# Patient Record
Sex: Male | Born: 1985 | Race: Black or African American | Hispanic: No | Marital: Single | Smoking: Current every day smoker
Health system: Southern US, Community
[De-identification: ages and names within clinical notes are randomized; demographics above are authoritative.]

---

## 2006-10-14 ENCOUNTER — Emergency Department (HOSPITAL_COMMUNITY): Admission: EM | Admit: 2006-10-14 | Discharge: 2006-10-14 | Payer: Self-pay | Admitting: Family Medicine

## 2006-10-17 ENCOUNTER — Emergency Department (HOSPITAL_COMMUNITY): Admission: EM | Admit: 2006-10-17 | Discharge: 2006-10-17 | Payer: Self-pay | Admitting: Emergency Medicine

## 2013-06-17 ENCOUNTER — Emergency Department (HOSPITAL_COMMUNITY)
Admission: EM | Admit: 2013-06-17 | Discharge: 2013-06-17 | Disposition: A | Discharge status: Home / Self Care | Payer: No Typology Code available for payment source | Attending: Emergency Medicine | Admitting: Emergency Medicine

## 2013-06-17 ENCOUNTER — Encounter (HOSPITAL_COMMUNITY): Payer: Self-pay | Admitting: Emergency Medicine

## 2013-06-17 ENCOUNTER — Emergency Department (HOSPITAL_COMMUNITY): Payer: No Typology Code available for payment source

## 2013-06-17 DIAGNOSIS — M791 Myalgia, unspecified site: Secondary | ICD-10-CM

## 2013-06-17 DIAGNOSIS — IMO0002 Reserved for concepts with insufficient information to code with codable children: Secondary | ICD-10-CM | POA: Insufficient documentation

## 2013-06-17 DIAGNOSIS — S0993XA Unspecified injury of face, initial encounter: Secondary | ICD-10-CM | POA: Insufficient documentation

## 2013-06-17 DIAGNOSIS — S298XXA Other specified injuries of thorax, initial encounter: Secondary | ICD-10-CM | POA: Insufficient documentation

## 2013-06-17 DIAGNOSIS — Y92410 Unspecified street and highway as the place of occurrence of the external cause: Secondary | ICD-10-CM | POA: Insufficient documentation

## 2013-06-17 DIAGNOSIS — S46909A Unspecified injury of unspecified muscle, fascia and tendon at shoulder and upper arm level, unspecified arm, initial encounter: Secondary | ICD-10-CM | POA: Insufficient documentation

## 2013-06-17 DIAGNOSIS — F17200 Nicotine dependence, unspecified, uncomplicated: Secondary | ICD-10-CM | POA: Insufficient documentation

## 2013-06-17 DIAGNOSIS — S4980XA Other specified injuries of shoulder and upper arm, unspecified arm, initial encounter: Secondary | ICD-10-CM | POA: Insufficient documentation

## 2013-06-17 DIAGNOSIS — M25511 Pain in right shoulder: Secondary | ICD-10-CM

## 2013-06-17 DIAGNOSIS — Y9389 Activity, other specified: Secondary | ICD-10-CM | POA: Insufficient documentation

## 2013-06-17 LAB — RAPID URINE DRUG SCREEN, HOSP PERFORMED
Amphetamines: NOT DETECTED
Benzodiazepines: NOT DETECTED
Opiates: NOT DETECTED

## 2013-06-17 MED ORDER — CYCLOBENZAPRINE HCL 10 MG PO TABS: 10.0000 mg | ORAL_TABLET | Freq: Two times a day (BID) | ORAL | Status: DC | PRN
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
## 2013-06-19 NOTE — ED Provider Notes (Signed)
Medical screening examination/treatment/procedure(s) were performed by non-physician practitioner and as supervising physician I was immediately available for consultation/collaboration.  Flint Melter, MD 06/19/13 Lyda Jester

## 2013-06-20 ENCOUNTER — Inpatient Hospital Stay: Payer: Self-pay

## 2013-09-05 ENCOUNTER — Encounter (HOSPITAL_COMMUNITY): Payer: Self-pay | Admitting: Emergency Medicine

## 2013-09-05 ENCOUNTER — Emergency Department (INDEPENDENT_AMBULATORY_CARE_PROVIDER_SITE_OTHER)
Admission: EM | Admit: 2013-09-05 | Discharge: 2013-09-05 | Disposition: A | Discharge status: Home / Self Care | Payer: Self-pay | Source: Home / Self Care | Attending: Emergency Medicine | Admitting: Emergency Medicine

## 2013-09-05 DIAGNOSIS — H109 Unspecified conjunctivitis: Secondary | ICD-10-CM

## 2013-09-05 MED ORDER — POLYMYXIN B-TRIMETHOPRIM 10000-0.1 UNIT/ML-% OP SOLN: 1.0000 [drp] | OPHTHALMIC | Status: DC

## 2013-09-05 NOTE — ED Notes (Signed)
C/o  Pain.  Redness.  And irritation of the right eye x 2 days.  States "eye is crusted over in the morning".  Denies fever, n/v/d.  No relief with otc eye drops.

## 2013-09-05 NOTE — ED Provider Notes (Signed)
  Chief Complaint   Chief Complaint  Patient presents with  . Conjunctivitis    History of Present Illness   Cecille AmsterdamCory J Moone is a 28 year old male three-day history of redness, irritation, itching, and burning the right eye. He's had a small amount of yellow drainage and some photophobia. His vision is normal. No trauma or foreign body. No suspicious exposures. He's had slight cough but no other upper respiratory symptoms.  Review of Systems   Other than as noted above, the patient denies any of the following symptoms: Systemic:  No fever, chills, sweats, fatigue, or weight loss. Eye:  No redness, eye pain, photophobia, discharge, blurred vision, or diplopia. ENT:  No nasal congestion, rhinorrhea, or sore throat. Lymphatic:  No adenopathy. Skin:  No rash or pruritis.  PMFSH   Past medical history, family history, social history, meds, and allergies were reviewed.    Physical Examination    Vital signs:  BP 112/77  Pulse 60  Temp(Src) 98.8 F (37.1 C) (Oral)  Resp 16  SpO2 100%  Visual Acuity:  Right Eye Distance: 20/25 Left Eye Distance: 20/30 Bilateral Distance: 20/20  General:  Alert and in no distress. Eye:  His lids are slightly swollen and tender. He has moderate conjunctival injection. No discharge or foreign body noted. Cornea was intact to fluorescein staining, anterior chambers normal, PERRLA, full EOMs. Fundi were benign. ENT:  TMs and canals clear.  Nasal mucosa normal.  No intra-oral lesions, mucous membranes moist, pharynx clear. Neck:  No adenopathy tenderness or mass. Skin:  Clear, warm and dry.  Assessment   The encounter diagnosis was Conjunctivitis.  Plan     1.  Meds:  The following meds were prescribed:   Discharge Medication List as of 09/05/2013  9:47 AM    START taking these medications   Details  trimethoprim-polymyxin b (POLYTRIM) ophthalmic solution Place 1 drop into the right eye every 4 (four) hours., Starting 09/05/2013, Until  Discontinued, Normal        2.  Patient Education/Counseling:  The patient was given appropriate handouts, self care instructions, and instructed in symptomatic relief.    3.  Follow up:  The patient was told to follow up here if no better in 3 to 4 days, or sooner if becoming worse in any way, and given some red flag symptoms such as increasing pain or changes in vision which would prompt immediate return.  Follow up here as needed.      Reuben Likesavid C Baltazar Pekala, MD 09/05/13 1025

## 2013-09-05 NOTE — Discharge Instructions (Signed)
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
## 2014-11-14 ENCOUNTER — Emergency Department (HOSPITAL_COMMUNITY)
Admission: EM | Admit: 2014-11-14 | Discharge: 2014-11-14 | Disposition: A | Discharge status: Home / Self Care | Payer: Self-pay | Attending: Emergency Medicine | Admitting: Emergency Medicine

## 2014-11-14 ENCOUNTER — Encounter (HOSPITAL_COMMUNITY): Payer: Self-pay | Admitting: Emergency Medicine

## 2014-11-14 DIAGNOSIS — Z720 Tobacco use: Secondary | ICD-10-CM | POA: Insufficient documentation

## 2014-11-14 DIAGNOSIS — B029 Zoster without complications: Secondary | ICD-10-CM | POA: Insufficient documentation

## 2014-11-14 MED ORDER — PREDNISONE 20 MG PO TABS: 60.0000 mg | ORAL_TABLET | Freq: Every day | ORAL | Status: AC

## 2014-11-14 MED ORDER — PREDNISONE 20 MG PO TABS
60.0000 mg | ORAL_TABLET | Freq: Once | ORAL | Status: AC
  Administered 2014-11-14: 60 mg via ORAL
  Filled 2014-11-14: qty 3

## 2014-11-14 MED ORDER — OXYCODONE-ACETAMINOPHEN 5-325 MG PO TABS: 1.0000 | ORAL_TABLET | ORAL | Status: AC | PRN

## 2014-11-14 MED ORDER — OXYCODONE-ACETAMINOPHEN 5-325 MG PO TABS
1.0000 | ORAL_TABLET | Freq: Once | ORAL | Status: AC
  Administered 2014-11-14: 1 via ORAL
  Filled 2014-11-14: qty 1

## 2014-11-14 MED ORDER — ACYCLOVIR 200 MG PO CAPS: 800.0000 mg | ORAL_CAPSULE | Freq: Every day | ORAL | Status: AC

## 2014-11-14 NOTE — ED Provider Notes (Signed)
CSN: [id number]     Arrival date & time 11/14/14  0035 History   First MD Initiated Contact with Patient 11/14/14 0324     This chart was scribed for Vincent Boozeavid Joe Gee, MD by Arlan OrganAshley Leger, ED Scribe. This patient was seen in room A06C/A06C and the patient's care was started 3:26 AM.   Chief Complaint  Patient presents with  . Rash   The history is provided by the patient. No language interpreter was used.    HPI Comments: Vincent Rivera is a 29 y.o. male without any pertinent past medical historywho presents to the Emergency Department complaining of constant, ongoing, progressively worsening, painful, vesicular rash to the T-10 nerve root distribution x 2 days. Pt also reports burning, sharp sensation to same area x 1 week. He has tried OTC topical anti-itch ointment without any improvement for symptoms. No recent fever, chills, nausea, vomiting, or diarrhea. No known allergies to medications.  History reviewed. No pertinent past medical history. History reviewed. No pertinent past surgical history. No family history on file. History  Substance Use Topics  . Smoking status: Current Every Day Smoker -- 0.50 packs/day    Types: Cigarettes  . Smokeless tobacco: Not on file  . Alcohol Use: Yes    Review of Systems  Constitutional: Negative for fever and chills.  Respiratory: Negative for shortness of breath.   Gastrointestinal: Negative for nausea, vomiting and diarrhea.  Skin: Positive for rash.  Psychiatric/Behavioral: Negative for confusion.      Allergies  Review of patient's allergies indicates no known allergies.  Home Medications   Prior to Admission medications   Medication Sig Start Date End Date Taking? Authorizing Provider  cyclobenzaprine (FLEXERIL) 10 MG tablet Take 1 tablet (10 mg total) by mouth 2 (two) times daily as needed for muscle spasms. 06/17/13   Marissa Sciacca, PA-C  trimethoprim-polymyxin b (POLYTRIM) ophthalmic solution Place 1 drop into the right eye  every 4 (four) hours. 09/05/13   Reuben Likesavid C Keller, MD   Triage Vitals: BP 128/76 mmHg  Pulse 65  Temp(Src) 97.8 F (36.6 C) (Oral)  Resp 16  SpO2 100%   Physical Exam  Constitutional: He is oriented to person, place, and time. He appears well-developed and well-nourished.  HENT:  Head: Normocephalic and atraumatic.  Eyes: EOM are normal. Pupils are equal, round, and reactive to light.  Neck: Normal range of motion. Neck supple. No JVD present.  Cardiovascular: Normal rate, regular rhythm, normal heart sounds and intact distal pulses.   No murmur heard. Pulmonary/Chest: Effort normal and breath sounds normal. He has no wheezes. He has no rales. He exhibits no tenderness.  Abdominal: Soft. Bowel sounds are normal. He exhibits no distension and no mass.  Musculoskeletal: Normal range of motion. He exhibits no edema.  Lymphadenopathy:    He has no cervical adenopathy.  Neurological: He is alert and oriented to person, place, and time. No cranial nerve deficit. He exhibits normal muscle tone. Coordination normal.  Skin: Skin is warm and dry. Rash noted. There is erythema.  Vesicular rash on erythematous base of L mid abdomen and upper lumbar area corresponding to T-10 dermatone consistent with herpes zoster   Psychiatric: He has a normal mood and affect. His behavior is normal. Judgment and thought content normal.  Nursing note and vitals reviewed.   ED Course  Procedures (including critical care time)  DIAGNOSTIC STUDIES: Oxygen Saturation is 100% on RA, Normal by my interpretation.    COORDINATION OF CARE: 3:32 AM-Discussed treatment plan  with pt at bedside and pt agreed to plan.     MDM   Final diagnoses:  Herpes zoster    Rash and symptoms typical of herpes zoster in the left T10 dermatome. He is discharged with prescriptions for prednisone, oxycodone acetaminophen, and acyclovir.  I personally performed the services described in this documentation, which was scribed in my  presence. The recorded information has been reviewed and is accurate.      Vincent Boozeavid Macel Yearsley, MD 11/14/14 [phone]

## 2014-11-14 NOTE — Discharge Instructions (Signed)
Take acetaminophen or ibuprofen for less severe pain.   Shingles Shingles (herpes zoster) is an infection that is caused by the same virus that causes chickenpox (varicella). The infection causes a painful skin rash and fluid-filled blisters, which eventually break open, crust over, and heal. It may occur in any area of the body, but it usually affects only one side of the body or face. The pain of shingles usually lasts about 1 month. However, some people with shingles may develop long-term (chronic) pain in the affected area of the body. Shingles often occurs many years after the person had chickenpox. It is more common:  In people older than 50 years.  In people with weakened immune systems, such as those with HIV, AIDS, or cancer.  In people taking medicines that weaken the immune system, such as transplant medicines.  In people under great stress. CAUSES  Shingles is caused by the varicella zoster virus (VZV), which also causes chickenpox. After a person is infected with the virus, it can remain in the person's body for years in an inactive state (dormant). To cause shingles, the virus reactivates and breaks out as an infection in a nerve root. The virus can be spread from person to person (contagious) through contact with open blisters of the shingles rash. It will only spread to people who have not had chickenpox. When these people are exposed to the virus, they may develop chickenpox. They will not develop shingles. Once the blisters scab over, the person is no longer contagious and cannot spread the virus to others. SIGNS AND SYMPTOMS  Shingles shows up in stages. The initial symptoms may be pain, itching, and tingling in an area of the skin. This pain is usually described as burning, stabbing, or throbbing.In a few days or weeks, a painful red rash will appear in the area where the pain, itching, and tingling were felt. The rash is usually on one side of the body in a band or belt-like  pattern. Then, the rash usually turns into fluid-filled blisters. They will scab over and dry up in approximately 2-3 weeks. Flu-like symptoms may also occur with the initial symptoms, the rash, or the blisters. These may include:  Fever.  Chills.  Headache.  Upset stomach. DIAGNOSIS  Your health care provider will perform a skin exam to diagnose shingles. Skin scrapings or fluid samples may also be taken from the blisters. This sample will be examined under a microscope or sent to a lab for further testing. TREATMENT  There is no specific cure for shingles. Your health care provider will likely prescribe medicines to help you manage the pain, recover faster, and avoid long-term problems. This may include antiviral drugs, anti-inflammatory drugs, and pain medicines. HOME CARE INSTRUCTIONS   Take a cool bath or apply cool compresses to the area of the rash or blisters as directed. This may help with the pain and itching.   Take medicines only as directed by your health care provider.   Rest as directed by your health care provider.  Keep your rash and blisters clean with mild soap and cool water or as directed by your health care provider.  Do not pick your blisters or scratch your rash. Apply an anti-itch cream or numbing creams to the affected area as directed by your health care provider.  Keep your shingles rash covered with a loose bandage (dressing).  Avoid skin contact with:  Babies.   Pregnant women.   Children with eczema.   Elderly people with  transplants.   People with chronic illnesses, such as leukemia or AIDS.   Wear loose-fitting clothing to help ease the pain of material rubbing against the rash.  Keep all follow-up visits as directed by your health care provider.If the area involved is on your face, you may receive a referral for a specialist, such as an eye doctor (ophthalmologist) or an ear, nose, and throat (ENT) doctor. Keeping all follow-up  visits will help you avoid eye problems, chronic pain, or disability.  SEEK IMMEDIATE MEDICAL CARE IF:   You have facial pain, pain around the eye area, or loss of feeling on one side of your face.  You have ear pain or ringing in your ear.  You have loss of taste.  Your pain is not relieved with prescribed medicines.   Your redness or swelling spreads.   You have more pain and swelling.  Your condition is worsening or has changed.   You have a fever. MAKE SURE YOU:  Understand these instructions.  Will watch your condition.  Will get help right away if you are not doing well or get worse. Document Released: 06/23/2005 Document Revised: 11/07/2013 Document Reviewed: 02/05/2012 [facility] Patient Information 2015 [employer] [place]. This information is not intended to replace advice given to you by your health care provider. Make sure you discuss any questions you have with your health care provider.  Acyclovir tablets or capsules What is this medicine? ACYCLOVIR (ay SYE kloe veer) is an antiviral medicine. It is used to treat or prevent infections caused by certain kinds of viruses. Examples of these infections include herpes and shingles. This medicine will not cure herpes. This medicine may be used for other purposes; ask your health care provider or pharmacist if you have questions. COMMON BRAND NAME(S): Zovirax What should I tell my health care provider before I take this medicine? They need to know if you have any of these conditions: -kidney disease -an unusual or allergic reaction to acyclovir, ganciclovir, valacyclovir, other medicines, foods, dyes, or preservatives -pregnant or trying to get pregnant -breast-feeding How should I use this medicine? Take this medicine by mouth with a glass of water. Follow the directions on the prescription label. You can take it with or without food. Take your medicine at regular intervals. Do not take your medicine more often than  directed. Take all of your medicine as directed even if you think your are better. Do not skip doses or stop your medicine early. Talk to your pediatrician regarding the use of this medicine in children. While this drug may be prescribed for selected conditions, precautions do apply. Overdosage: If you think you have taken too much of this medicine contact a poison control center or emergency room at once. NOTE: This medicine is only for you. Do not share this medicine with others. What if I miss a dose? If you miss a dose, take it as soon as you can. If it is almost time for your next dose, take only that dose. Do not take double or extra doses. What may interact with this medicine? -probenecid This list may not describe all possible interactions. Give your health care provider a list of all the medicines, herbs, non-prescription drugs, or dietary supplements you use. Also tell them if you smoke, drink alcohol, or use illegal drugs. Some items may interact with your medicine. What should I watch for while using this medicine? Tell your doctor or health care professional if your symptoms do not improve. This medicine works best  when started very early in the course of an infection. Begin treatment at the first signs of infection. Drink 6 to 8 glasses of water or fluids every day while you are taking this medicine. This will help prevent side effects. You can still pass chickenpox, shingles, or herpes to another person even while you are taking this medicine. Avoid contact with others as directed. Genital herpes is a sexually transmitted disease. Talk to your doctor about how to stop the spread of infection. What side effects may I notice from receiving this medicine? Side effects that you should report to your doctor or health care professional as soon as possible: -allergic reactions like skin rash, itching or hives, swelling of the face, lips, or tongue -chest pain -confusion, hallucinations,  tremor -dark urine -increased sensitivity to the sun -redness, blistering, peeling or loosening of the skin, including inside the mouth -seizures -trouble passing urine or change in the amount of urine -unusual bleeding or bruising, or pinpoint red spots on the skin -unusually weak or tired -yellowing of the eyes or skin Side effects that usually do not require medical attention (report to your doctor or health care professional if they continue or are bothersome): -diarrhea -fever -headache -nausea, vomiting -stomach upset This list may not describe all possible side effects. Call your doctor for medical advice about side effects. You may report side effects to FDA at 1-800-FDA-1088. Where should I keep my medicine? Keep out of the reach of children. Store at room temperature between 15 and 25 degrees C (59 and 77 degrees F). Throw away any unused medicine after the expiration date. NOTE: This sheet is a summary. It may not cover all possible information. If you have questions about this medicine, talk to your doctor, pharmacist, or health care provider.  2015, [employer] Standard. (2007-09-08 13:15:46)  Prednisone tablets What is this medicine? PREDNISONE (PRED ni sone) is a corticosteroid. It is commonly used to treat inflammation of the skin, joints, lungs, and other organs. Common conditions treated include asthma, allergies, and arthritis. It is also used for other conditions, such as blood disorders and diseases of the adrenal glands. This medicine may be used for other purposes; ask your health care provider or pharmacist if you have questions. COMMON BRAND NAME(S): Deltasone, Predone, Sterapred, Sterapred DS What should I tell my health care provider before I take this medicine? They need to know if you have any of these conditions: -Cushing's syndrome -diabetes -glaucoma -heart disease -high blood pressure -infection (especially a virus infection such as chickenpox,  cold sores, or herpes) -kidney disease -liver disease -mental illness -myasthenia gravis -osteoporosis -seizures -stomach or intestine problems -thyroid disease -an unusual or allergic reaction to lactose, prednisone, other medicines, foods, dyes, or preservatives -pregnant or trying to get pregnant -breast-feeding How should I use this medicine? Take this medicine by mouth with a glass of water. Follow the directions on the prescription label. Take this medicine with food. If you are taking this medicine once a day, take it in the morning. Do not take more medicine than you are told to take. Do not suddenly stop taking your medicine because you may develop a severe reaction. Your doctor will tell you how much medicine to take. If your doctor wants you to stop the medicine, the dose may be slowly lowered over time to avoid any side effects. Talk to your pediatrician regarding the use of this medicine in children. Special care may be needed. Overdosage: If you think you have taken too  much of this medicine contact a poison control center or emergency room at once. NOTE: This medicine is only for you. Do not share this medicine with others. What if I miss a dose? If you miss a dose, take it as soon as you can. If it is almost time for your next dose, talk to your doctor or health care professional. You may need to miss a dose or take an extra dose. Do not take double or extra doses without advice. What may interact with this medicine? Do not take this medicine with any of the following medications: -metyrapone -mifepristone This medicine may also interact with the following medications: -aminoglutethimide -amphotericin B -aspirin and aspirin-like medicines -barbiturates -certain medicines for diabetes, like glipizide or glyburide -cholestyramine -cholinesterase inhibitors -cyclosporine -digoxin -diuretics -ephedrine -male hormones, like estrogens and birth control  pills -isoniazid -ketoconazole -NSAIDS, medicines for pain and inflammation, like ibuprofen or naproxen -phenytoin -rifampin -toxoids -vaccines -warfarin This list may not describe all possible interactions. Give your health care provider a list of all the medicines, herbs, non-prescription drugs, or dietary supplements you use. Also tell them if you smoke, drink alcohol, or use illegal drugs. Some items may interact with your medicine. What should I watch for while using this medicine? Visit your doctor or health care professional for regular checks on your progress. If you are taking this medicine over a prolonged period, carry an identification card with your name and address, the type and dose of your medicine, and your doctor's name and address. This medicine may increase your risk of getting an infection. Tell your doctor or health care professional if you are around anyone with measles or chickenpox, or if you develop sores or blisters that do not heal properly. If you are going to have surgery, tell your doctor or health care professional that you have taken this medicine within the last twelve months. Ask your doctor or health care professional about your diet. You may need to lower the amount of salt you eat. This medicine may affect blood sugar levels. If you have diabetes, check with your doctor or health care professional before you change your diet or the dose of your diabetic medicine. What side effects may I notice from receiving this medicine? Side effects that you should report to your doctor or health care professional as soon as possible: -allergic reactions like skin rash, itching or hives, swelling of the face, lips, or tongue -changes in emotions or moods -changes in vision -depressed mood -eye pain -fever or chills, cough, sore throat, pain or difficulty passing urine -increased thirst -swelling of ankles, feet Side effects that usually do not require medical  attention (report to your doctor or health care professional if they continue or are bothersome): -confusion, excitement, restlessness -headache -nausea, vomiting -skin problems, acne, thin and shiny skin -trouble sleeping -weight gain This list may not describe all possible side effects. Call your doctor for medical advice about side effects. You may report side effects to FDA at 1-800-FDA-1088. Where should I keep my medicine? Keep out of the reach of children. Store at room temperature between 15 and 30 degrees C (59 and 86 degrees F). Protect from light. Keep container tightly closed. Throw away any unused medicine after the expiration date. NOTE: This sheet is a summary. It may not cover all possible information. If you have questions about this medicine, talk to your doctor, pharmacist, or health care provider.  2015, [employer] Standard. (2011-02-06 10:57:14)  Acetaminophen; Oxycodone tablets What is  this medicine? ACETAMINOPHEN; OXYCODONE (a set a MEE noe fen; ox i KOE done) is a pain reliever. It is used to treat mild to moderate pain. This medicine may be used for other purposes; ask your health care provider or pharmacist if you have questions. COMMON BRAND NAME(S): Endocet, Magnacet, Narvox, Percocet, Perloxx, Primalev, Primlev, Roxicet, Xolox What should I tell my health care provider before I take this medicine? They need to know if you have any of these conditions: -brain tumor -Crohn's disease, inflammatory bowel disease, or ulcerative colitis -drug abuse or addiction -head injury -heart or circulation problems -if you often drink alcohol -kidney disease or problems going to the bathroom -liver disease -lung disease, asthma, or breathing problems -an unusual or allergic reaction to acetaminophen, oxycodone, other opioid analgesics, other medicines, foods, dyes, or preservatives -pregnant or trying to get pregnant -breast-feeding How should I use this  medicine? Take this medicine by mouth with a full glass of water. Follow the directions on the prescription label. Take your medicine at regular intervals. Do not take your medicine more often than directed. Talk to your pediatrician regarding the use of this medicine in children. Special care may be needed. Patients over 67 years old may have a stronger reaction and need a smaller dose. Overdosage: If you think you have taken too much of this medicine contact a poison control center or emergency room at once. NOTE: This medicine is only for you. Do not share this medicine with others. What if I miss a dose? If you miss a dose, take it as soon as you can. If it is almost time for your next dose, take only that dose. Do not take double or extra doses. What may interact with this medicine? -alcohol -antihistamines -barbiturates like amobarbital, butalbital, butabarbital, methohexital, pentobarbital, phenobarbital, thiopental, and secobarbital -benztropine -drugs for bladder problems like solifenacin, trospium, oxybutynin, tolterodine, hyoscyamine, and methscopolamine -drugs for breathing problems like ipratropium and tiotropium -drugs for certain stomach or intestine problems like propantheline, homatropine methylbromide, glycopyrrolate, atropine, belladonna, and dicyclomine -general anesthetics like etomidate, ketamine, nitrous oxide, propofol, desflurane, enflurane, halothane, isoflurane, and sevoflurane -medicines for depression, anxiety, or psychotic disturbances -medicines for sleep -muscle relaxants -naltrexone -narcotic medicines (opiates) for pain -phenothiazines like perphenazine, thioridazine, chlorpromazine, mesoridazine, fluphenazine, prochlorperazine, promazine, and trifluoperazine -scopolamine -tramadol -trihexyphenidyl This list may not describe all possible interactions. Give your health care provider a list of all the medicines, herbs, non-prescription drugs, or dietary  supplements you use. Also tell them if you smoke, drink alcohol, or use illegal drugs. Some items may interact with your medicine. What should I watch for while using this medicine? Tell your doctor or health care professional if your pain does not go away, if it gets worse, or if you have new or a different type of pain. You may develop tolerance to the medicine. Tolerance means that you will need a higher dose of the medication for pain relief. Tolerance is normal and is expected if you take this medicine for a long time. Do not suddenly stop taking your medicine because you may develop a severe reaction. Your body becomes used to the medicine. This does NOT mean you are addicted. Addiction is a behavior related to getting and using a drug for a non-medical reason. If you have pain, you have a medical reason to take pain medicine. Your doctor will tell you how much medicine to take. If your doctor wants you to stop the medicine, the dose will be slowly lowered over time  to avoid any side effects. You may get drowsy or dizzy. Do not drive, use machinery, or do anything that needs mental alertness until you know how this medicine affects you. Do not stand or sit up quickly, especially if you are an older patient. This reduces the risk of dizzy or fainting spells. Alcohol may interfere with the effect of this medicine. Avoid alcoholic drinks. There are different types of narcotic medicines (opiates) for pain. If you take more than one type at the same time, you may have more side effects. Give your health care provider a list of all medicines you use. Your doctor will tell you how much medicine to take. Do not take more medicine than directed. Call emergency for help if you have problems breathing. The medicine will cause constipation. Try to have a bowel movement at least every 2 to 3 days. If you do not have a bowel movement for 3 days, call your doctor or health care professional. Do not take Tylenol  (acetaminophen) or medicines that have acetaminophen with this medicine. Too much acetaminophen can be very dangerous. Many nonprescription medicines contain acetaminophen. Always read the labels carefully to avoid taking more acetaminophen. What side effects may I notice from receiving this medicine? Side effects that you should report to your doctor or health care professional as soon as possible: -allergic reactions like skin rash, itching or hives, swelling of the face, lips, or tongue -breathing difficulties, wheezing -confusion -light headedness or fainting spells -severe stomach pain -unusually weak or tired -yellowing of the skin or the whites of the eyes Side effects that usually do not require medical attention (report to your doctor or health care professional if they continue or are bothersome): -dizziness -drowsiness -nausea -vomiting This list may not describe all possible side effects. Call your doctor for medical advice about side effects. You may report side effects to FDA at 1-800-FDA-1088. Where should I keep my medicine? Keep out of the reach of children. This medicine can be abused. Keep your medicine in a safe place to protect it from theft. Do not share this medicine with anyone. Selling or giving away this medicine is dangerous and against the law. Store at room temperature between 20 and 25 degrees C (68 and 77 degrees F). Keep container tightly closed. Protect from light. This medicine may cause accidental overdose and death if it is taken by other adults, children, or pets. Flush any unused medicine down the toilet to reduce the chance of harm. Do not use the medicine after the expiration date. NOTE: This sheet is a summary. It may not cover all possible information. If you have questions about this medicine, talk to your doctor, pharmacist, or health care provider.  2015, [employer]. (2013-02-14 13:17:35)

## 2014-11-14 NOTE — ED Notes (Signed)
Pt. reports papular rashes at abdomen , back and inner left thigh onset last week unrelieved by OTC antiitch lotion .

## 2019-03-13 ENCOUNTER — Encounter (HOSPITAL_COMMUNITY): Payer: Self-pay | Admitting: Emergency Medicine

## 2019-03-13 ENCOUNTER — Other Ambulatory Visit: Payer: Self-pay

## 2019-03-13 ENCOUNTER — Emergency Department (HOSPITAL_COMMUNITY)
Admission: EM | Admit: 2019-03-13 | Discharge: 2019-03-13 | Disposition: A | Discharge status: Home / Self Care | Payer: Self-pay | Attending: Emergency Medicine | Admitting: Emergency Medicine

## 2019-03-13 DIAGNOSIS — R112 Nausea with vomiting, unspecified: Secondary | ICD-10-CM | POA: Insufficient documentation

## 2019-03-13 DIAGNOSIS — Z79899 Other long term (current) drug therapy: Secondary | ICD-10-CM | POA: Insufficient documentation

## 2019-03-13 DIAGNOSIS — R1084 Generalized abdominal pain: Secondary | ICD-10-CM | POA: Insufficient documentation

## 2019-03-13 DIAGNOSIS — F17210 Nicotine dependence, cigarettes, uncomplicated: Secondary | ICD-10-CM | POA: Insufficient documentation

## 2019-03-13 LAB — COMPREHENSIVE METABOLIC PANEL
ALT: 27 U/L (ref 0–44)
AST: 26 U/L (ref 15–41)
Albumin: 3.9 g/dL (ref 3.5–5.0)
Alkaline Phosphatase: 53 U/L (ref 38–126)
Anion gap: 7 (ref 5–15)
BUN: 10 mg/dL (ref 6–20)
CO2: 27 mmol/L (ref 22–32)
Calcium: 9.1 mg/dL (ref 8.9–10.3)
Chloride: 105 mmol/L (ref 98–111)
Creatinine, Ser: 0.97 mg/dL (ref 0.61–1.24)
GFR calc Af Amer: >60 mL/min (ref >60)
GFR calc non Af Amer: >60 mL/min (ref >60)
Glucose, Bld: 115 mg/dL — ABNORMAL HIGH (ref 70–99)
Potassium: 4.2 mmol/L (ref 3.5–5.1)
Sodium: 139 mmol/L (ref 135–145)
Total Bilirubin: 0.5 mg/dL (ref 0.3–1.2)
Total Protein: 7.1 g/dL (ref 6.5–8.1)

## 2019-03-13 LAB — CBC
HCT: 47.4 % (ref 39.0–52.0)
Hemoglobin: 15.4 g/dL (ref 13.0–17.0)
MCH: 30.7 pg (ref 26.0–34.0)
MCHC: 32.5 g/dL (ref 30.0–36.0)
MCV: 94.4 fL (ref 80.0–100.0)
Platelets: 321 10*3/uL (ref 150–400)
RBC: 5.02 MIL/uL (ref 4.22–5.81)
RDW: 12.9 % (ref 11.5–15.5)
WBC: 9.3 10*3/uL (ref 4.0–10.5)
nRBC: 0 % (ref 0.0–0.2)

## 2019-03-13 LAB — LIPASE, BLOOD: Lipase: 65 U/L — ABNORMAL HIGH (ref 11–51)

## 2019-03-13 MED ORDER — DICYCLOMINE HCL 10 MG/ML IM SOLN
10.0000 mg | Freq: Once | INTRAMUSCULAR | Status: AC
  Administered 2019-03-13: 09:00:00 10 mg via INTRAMUSCULAR
  Filled 2019-03-13: qty 2

## 2019-03-13 MED ORDER — ONDANSETRON HCL 4 MG/2ML IJ SOLN
4.0000 mg | Freq: Once | INTRAMUSCULAR | Status: AC
  Administered 2019-03-13: 08:00:00 4 mg via INTRAVENOUS
  Filled 2019-03-13: qty 2

## 2019-03-13 MED ORDER — SODIUM CHLORIDE 0.9% FLUSH: 3.0000 mL | Freq: Once | INTRAVENOUS | Status: DC

## 2019-03-13 MED ORDER — DICYCLOMINE HCL 20 MG PO TABS: 20.0000 mg | ORAL_TABLET | Freq: Two times a day (BID) | ORAL | 0 refills | Status: AC

## 2019-03-13 MED ORDER — SODIUM CHLORIDE 0.9 % IV BOLUS
1000.0000 mL | Freq: Once | INTRAVENOUS | Status: AC
  Administered 2019-03-13: 09:00:00 1000 mL via INTRAVENOUS

## 2019-03-13 MED ORDER — ONDANSETRON 4 MG PO TBDP: 4.0000 mg | ORAL_TABLET | Freq: Three times a day (TID) | ORAL | 0 refills | Status: AC | PRN

## 2019-03-13 MED ORDER — KETOROLAC TROMETHAMINE 15 MG/ML IJ SOLN
15.0000 mg | Freq: Once | INTRAMUSCULAR | Status: AC
  Administered 2019-03-13: 09:00:00 15 mg via INTRAVENOUS
  Filled 2019-03-13: qty 1

## 2019-03-13 NOTE — Discharge Instructions (Addendum)
As discussed, your evaluation today has been largely reassuring.  But, it is important that you monitor your condition carefully, and do not hesitate to return to the ED if you develop new, or concerning changes in your condition.  You are likely experiencing inflammation, possibly due to your recent dietary intake and/or alcohol use.

## 2019-03-13 NOTE — ED Provider Notes (Signed)
[facility]   CSN: [id number] Arrival date & time: 03/13/19  [id number]     History   Chief Complaint Chief Complaint  Patient presents with  . Emesis  . Diarrhea    HPI Vincent AmsterdamCory J Rivera is a 33 y.o. male.     HPI  Patient presents with concern of nausea, vomiting, diarrhea, abdominal discomfort. Is here with a male companion who assists with the HPI. Patient awoke about 4 hours prior to ED arrival initially with chills, with subsequent development of all of the above. He states that he is generally well, has no medical problems, has had no history of abdominal surgery. Initially he notes that he ate at a fast food restaurant yesterday, but his male companion ate the same food, and and is asymptomatic. Subsequently he notes that the day prior he ate from a food truck, there was a unable to finish his food because it was "nasty". Since onset earlier today no relieving or exacerbating factors, and the patient has substantial discomfort diffusely across the lower abdomen with associated nausea, vomiting, diarrhea.  History reviewed. No pertinent past medical history.  There are no active problems to display for this patient.   History reviewed. No pertinent surgical history.      Home Medications    Prior to Admission medications   Medication Sig Start Date End Date Taking? Authorizing Provider  acyclovir (ZOVIRAX) 200 MG capsule Take 4 capsules (800 mg total) by mouth 5 (five) times daily. 11/14/14   Dione BoozeGlick, David, MD  oxyCODONE-acetaminophen (PERCOCET) 5-325 MG per tablet Take 1 tablet by mouth every 4 (four) hours as needed for moderate pain. 11/14/14   Dione BoozeGlick, David, MD  predniSONE (DELTASONE) 20 MG tablet Take 3 tablets (60 mg total) by mouth daily. 11/14/14   Dione BoozeGlick, David, MD    Family History No family history on file.  Social History Social History   Tobacco Use  . Smoking status: Current Every Day Smoker   Packs/day: 0.50    Types: Cigarettes  Substance Use Topics  . Alcohol use: Yes  . Drug use: Yes    Frequency: 3.0 times per week    Types: Marijuana     Allergies   Patient has no known allergies.   Review of Systems Review of Systems  Constitutional:       Per HPI, otherwise negative  HENT:       Per HPI, otherwise negative  Respiratory:       Per HPI, otherwise negative  Cardiovascular:       Per HPI, otherwise negative  Gastrointestinal: Positive for abdominal pain, diarrhea, nausea and vomiting.  Endocrine:       Negative aside from HPI  Genitourinary:       Neg aside from HPI   Musculoskeletal:       Per HPI, otherwise negative  Skin: Negative.   Neurological: Negative for syncope.     Physical Exam Updated Vital Signs BP 120/89 (BP Location: Right Arm)   Pulse 72   Temp 98.4 F (36.9 C) (Oral)   Resp 16   Ht 6\' 1"  (1.854 m)   Wt 86.2 kg   SpO2 100%   BMI 25.07 kg/m   Physical Exam Vitals signs and nursing note reviewed.  Constitutional:      Appearance: He is well-developed. He is ill-appearing.     Comments: Ill appearing M crying  HENT:     Head: Normocephalic and atraumatic.  Eyes:  Conjunctiva/sclera: Conjunctivae normal.  Cardiovascular:     Rate and Rhythm: Normal rate and regular rhythm.  Pulmonary:     Effort: Pulmonary effort is normal. No respiratory distress.     Breath sounds: No stridor.  Abdominal:     General: There is no distension.     Comments: ttp throughout w/o guarding or mass  Skin:    General: Skin is warm and dry.  Neurological:     Mental Status: He is alert and oriented to person, place, and time.      ED Treatments / Results  Labs (all labs ordered are listed, but only abnormal results are displayed) Labs Reviewed  LIPASE, BLOOD - Abnormal; Notable for the following components:      Result Value   Lipase 65 (*)    All other components within normal limits  COMPREHENSIVE METABOLIC PANEL - Abnormal;  Notable for the following components:   Glucose, Bld 115 (*)    All other components within normal limits  CBC  URINALYSIS, ROUTINE W REFLEX MICROSCOPIC    EKG None  Radiology No results found.  Procedures Procedures (including critical care time)  Medications Ordered in ED Medications  sodium chloride flush (NS) 0.9 % injection 3 mL (has no administration in time range)  sodium chloride 0.9 % bolus 1,000 mL (1,000 mLs Intravenous New Bag/Given 03/13/19 0832)  ondansetron (ZOFRAN) injection 4 mg (4 mg Intravenous Given 03/13/19 0826)  ketorolac (TORADOL) 15 MG/ML injection 15 mg (15 mg Intravenous Given 03/13/19 0833)  dicyclomine (BENTYL) injection 10 mg (10 mg Intramuscular Given 03/13/19 6283)     Initial Impression / Assessment and Plan / ED Course  I have reviewed the triage vital signs and the nursing notes.  Pertinent labs & imaging results that were available during my care of the patient were reviewed by me and considered in my medical decision making (see chart for details).        9:09 AM Patient sitting upright, smiling, in no distress, speaking clearly. Patient's labs reviewed with him and his male companion, notable for mild elevation in lipase otherwise reassuring. He has no ongoing complaints per We discussed possibilities, including food reaction versus alcohol intake, which he now acknowledges drinking. With no evidence for peritonitis, substantial improvement here, some suspicion for mild pancreatitis versus food reaction, the patient is appropriate for discharge with outpatient follow-up with GI as needed. With a lengthy conversation on dietary restrictions, return precautions as well.  Final Clinical Impressions(s) / ED Diagnoses   Final diagnoses:  Generalized abdominal pain    ED Discharge Orders         Ordered    dicyclomine (BENTYL) 20 MG tablet  2 times daily     03/13/19 0913    ondansetron (ZOFRAN ODT) 4 MG disintegrating tablet  Every 8  hours PRN     03/13/19 0913           [name], MD 03/13/19 [phone]

## 2019-03-13 NOTE — ED Notes (Signed)
Family at bedside. 

## 2019-03-13 NOTE — ED Notes (Signed)
Patient verbalizes understanding of discharge instructions . Opportunity for questions and answers were provided . Armband removed by staff ,Pt discharged from ED. W/C  offered at D/C  and Declined W/C at D/C and was escorted to lobby by RN.  

## 2019-03-13 NOTE — ED Triage Notes (Signed)
Blood work drawn by Rose Fillers RN but this Probation officer clicked off as drawn.

## 2019-03-13 NOTE — ED Triage Notes (Signed)
Patient woke up this morning with emesis and diarrhea , denies fever or chills , no abdominal pain .

## 2019-05-20 ENCOUNTER — Ambulatory Visit: Payer: Self-pay | Admitting: Registered Nurse

## 2019-05-24 ENCOUNTER — Encounter: Payer: Self-pay | Admitting: Registered Nurse

## 2019-06-09 ENCOUNTER — Encounter: Payer: Self-pay | Admitting: Emergency Medicine

## 2019-06-09 ENCOUNTER — Ambulatory Visit: Payer: BLUE CROSS/BLUE SHIELD | Admitting: Emergency Medicine

## 2019-06-09 ENCOUNTER — Other Ambulatory Visit: Payer: Self-pay

## 2019-06-09 ENCOUNTER — Other Ambulatory Visit (HOSPITAL_COMMUNITY)
Admission: RE | Admit: 2019-06-09 | Discharge: 2019-06-09 | Disposition: A | Discharge status: Home / Self Care | Payer: BLUE CROSS/BLUE SHIELD | Source: Ambulatory Visit | Attending: Emergency Medicine | Admitting: Emergency Medicine

## 2019-06-09 VITALS — BP 121/86 | HR 63 | Temp 98.2°F | Resp 16 | Ht 74.0 in | Wt 198.6 lb

## 2019-06-09 DIAGNOSIS — Z202 Contact with and (suspected) exposure to infections with a predominantly sexual mode of transmission: Secondary | ICD-10-CM

## 2019-06-09 DIAGNOSIS — Z7689 Persons encountering health services in other specified circumstances: Secondary | ICD-10-CM

## 2019-06-09 DIAGNOSIS — M549 Dorsalgia, unspecified: Secondary | ICD-10-CM | POA: Diagnosis not present

## 2019-06-09 DIAGNOSIS — Z711 Person with feared health complaint in whom no diagnosis is made: Secondary | ICD-10-CM | POA: Diagnosis not present

## 2019-06-09 DIAGNOSIS — Z833 Family history of diabetes mellitus: Secondary | ICD-10-CM | POA: Diagnosis not present

## 2019-06-09 DIAGNOSIS — R197 Diarrhea, unspecified: Secondary | ICD-10-CM | POA: Diagnosis not present

## 2019-06-09 DIAGNOSIS — R109 Unspecified abdominal pain: Secondary | ICD-10-CM

## 2019-06-09 LAB — POCT URINALYSIS DIP (MANUAL ENTRY)
Bilirubin, UA: NEGATIVE
Blood, UA: NEGATIVE
Glucose, UA: NEGATIVE mg/dL
Ketones, POC UA: NEGATIVE mg/dL
Leukocytes, UA: NEGATIVE
Nitrite, UA: NEGATIVE
Protein Ur, POC: NEGATIVE mg/dL
Spec Grav, UA: 1.025 (ref 1.010–1.025)
Urobilinogen, UA: 0.2 E.U./dL
pH, UA: 6 (ref 5.0–8.0)

## 2019-06-09 MED ORDER — AZITHROMYCIN 250 MG PO TABS: 1000.0000 mg | ORAL_TABLET | Freq: Once | ORAL | 0 refills | Status: AC

## 2019-06-09 NOTE — Progress Notes (Signed)
esta

## 2019-06-09 NOTE — Patient Instructions (Addendum)
   If you have lab work done today you will be contacted with your lab results within the next 2 weeks.  If you have not heard from us then please contact us. The fastest way to get your results is to register for My Chart.   IF you received an x-ray today, you will receive an invoice from [facility]. Please contact [facility] at [phone] with questions or concerns regarding your invoice.   IF you received labwork today, you will receive an invoice from [facility]. Please contact [facility] at [phone] with questions or concerns regarding your invoice.   Our billing staff will not be able to assist you with questions regarding bills from these companies.  You will be contacted with the lab results as soon as they are available. The fastest way to get your results is to activate your My Chart account. Instructions are located on the last page of this paperwork. If you have not heard from us regarding the results in 2 weeks, please contact this office.     Health Maintenance, Male Adopting a healthy lifestyle and getting preventive care are important in promoting health and wellness. Ask your health care provider about:  The right schedule for you to have regular tests and exams.  Things you can do on your own to prevent diseases and keep yourself healthy. What should I know about diet, weight, and exercise? Eat a healthy diet   Eat a diet that includes plenty of vegetables, fruits, low-fat dairy products, and lean protein.  Do not eat a lot of foods that are high in solid fats, added sugars, or sodium. Maintain a healthy weight Body mass index (BMI) is a measurement that can be used to identify possible weight problems. It estimates body fat based on height and weight. Your health care provider can help determine your BMI and help you achieve or maintain a healthy weight. Get regular exercise Get regular exercise. This is one of the most important things you  can do for your health. Most adults should:  Exercise for at least 150 minutes each week. The exercise should increase your heart rate and make you sweat (moderate-intensity exercise).  Do strengthening exercises at least twice a week. This is in addition to the moderate-intensity exercise.  Spend less time sitting. Even light physical activity can be beneficial. Watch cholesterol and blood lipids Have your blood tested for lipids and cholesterol at 33 years of age, then have this test every 5 years. You may need to have your cholesterol levels checked more often if:  Your lipid or cholesterol levels are high.  You are older than 33 years of age.  You are at high risk for heart disease. What should I know about cancer screening? Many types of cancers can be detected early and may often be prevented. Depending on your health history and family history, you may need to have cancer screening at various ages. This may include screening for:  Colorectal cancer.  Prostate cancer.  Skin cancer.  Lung cancer. What should I know about heart disease, diabetes, and high blood pressure? Blood pressure and heart disease  High blood pressure causes heart disease and increases the risk of stroke. This is more likely to develop in people who have high blood pressure readings, are of African descent, or are overweight.  Talk with your health care provider about your target blood pressure readings.  Have your blood pressure checked: ? Every 3-5 years if you are 18-39 years   of age. ? Every year if you are 40 years old or older.  If you are between the ages of 65 and 75 and are a current or former smoker, ask your health care provider if you should have a one-time screening for abdominal aortic aneurysm (AAA). Diabetes Have regular diabetes screenings. This checks your fasting blood sugar level. Have the screening done:  Once every three years after age 45 if you are at a normal weight and have  a low risk for diabetes.  More often and at a younger age if you are overweight or have a high risk for diabetes. What should I know about preventing infection? Hepatitis B If you have a higher risk for hepatitis B, you should be screened for this virus. Talk with your health care provider to find out if you are at risk for hepatitis B infection. Hepatitis C Blood testing is recommended for:  Everyone born from 1945 through 1965.  Anyone with known risk factors for hepatitis C. Sexually transmitted infections (STIs)  You should be screened each year for STIs, including gonorrhea and chlamydia, if: ? You are sexually active and are younger than 33 years of age. ? You are older than 33 years of age and your health care provider tells you that you are at risk for this type of infection. ? Your sexual activity has changed since you were last screened, and you are at increased risk for chlamydia or gonorrhea. Ask your health care provider if you are at risk.  Ask your health care provider about whether you are at high risk for HIV. Your health care provider may recommend a prescription medicine to help prevent HIV infection. If you choose to take medicine to prevent HIV, you should first get tested for HIV. You should then be tested every 3 months for as long as you are taking the medicine. Follow these instructions at home: Lifestyle  Do not use any products that contain nicotine or tobacco, such as cigarettes, e-cigarettes, and chewing tobacco. If you need help quitting, ask your health care provider.  Do not use street drugs.  Do not share needles.  Ask your health care provider for help if you need support or information about quitting drugs. Alcohol use  Do not drink alcohol if your health care provider tells you not to drink.  If you drink alcohol: ? Limit how much you have to 0-2 drinks a day. ? Be aware of how much alcohol is in your drink. In the [place] one drink equals one 12  oz bottle of beer (355 mL), one 5 oz glass of wine (148 mL), or one 1 oz glass of hard liquor (44 mL). General instructions  Schedule regular health, dental, and eye exams.  Stay current with your vaccines.  Tell your health care provider if: ? You often feel depressed. ? You have ever been abused or do not feel safe at home. Summary  Adopting a healthy lifestyle and getting preventive care are important in promoting health and wellness.  Follow your health care provider's instructions about healthy diet, exercising, and getting tested or screened for diseases.  Follow your health care provider's instructions on monitoring your cholesterol and blood pressure. This information is not intended to replace advice given to you by your health care provider. Make sure you discuss any questions you have with your health care provider. Document Released: 12/20/2007 Document Revised: 06/16/2018 Document Reviewed: 06/16/2018 [employer]vier Patient Education  2020 [employer]  

## 2019-06-09 NOTE — Progress Notes (Signed)
Vincent Rivera 32 y.o.   Chief Complaint  Patient presents with  . Establish Care    check for STD exposure  . Back Pain    lower for months and abdominal pain with diarrhea    HISTORY OF PRESENT ILLNESS: This is a 33 y.o. male first visit to this office, here to establish care with me.  No chronic medical problems.  Has the following complaints: 1.  Concern about STD.  Girlfriend recently diagnosed with STD.  May have been exposed to chlamydia. 2.  Left lumbar pain for over a year.  On and off sharp localized pain.  Works as a Hospital doctor for [employer].  No associated symptoms.  No urinary symptoms or hematuria.  Sometimes pain radiates to the front and into left lower abdominal area. 3.  Loose bowel movements about 2 to 3/day for the past 6 to 7 months.  No rectal bleeding.  Still eating and drinking.  Denies nausea or vomiting.  Denies fever or chills. Positive smoker half a pack per day since 33 years old.  Occasionally smokes weed.  Positive EtOH consumption, "couple of shots of liquor per day".  States he could eat better.  Adequate sleep. Family history positive for diabetes. No other complaints or medical concerns today.   HPI   Prior to Admission medications   Medication Sig Start Date End Date Taking? Authorizing Provider  acyclovir (ZOVIRAX) 200 MG capsule Take 4 capsules (800 mg total) by mouth 5 (five) times daily. Patient not taking: Reported on 06/09/2019 11/14/14   Dione Booze, MD  dicyclomine (BENTYL) 20 MG tablet Take 1 tablet (20 mg total) by mouth 2 (two) times daily for 5 days. 03/13/19 03/18/19  Gerhard Munch, MD  ondansetron (ZOFRAN ODT) 4 MG disintegrating tablet Take 1 tablet (4 mg total) by mouth every 8 (eight) hours as needed for nausea or vomiting. Patient not taking: Reported on 06/09/2019 03/13/19   Gerhard Munch, MD  oxyCODONE-acetaminophen (PERCOCET) 5-325 MG per tablet Take 1 tablet by mouth every 4 (four) hours as needed for moderate pain. Patient not  taking: Reported on 06/09/2019 11/14/14   Dione Booze, MD  predniSONE (DELTASONE) 20 MG tablet Take 3 tablets (60 mg total) by mouth daily. Patient not taking: Reported on 06/09/2019 11/14/14   Dione Booze, MD    No Known Allergies  There are no active problems to display for this patient.   History reviewed. No pertinent past medical history.  History reviewed. No pertinent surgical history.  Social History   Socioeconomic History  . Marital status: Single    Spouse name: Not on file  . Number of children: Not on file  . Years of education: Not on file  . Highest education level: Not on file  Occupational History  . Not on file  Social Needs  . Financial resource strain: Not on file  . Food insecurity    Worry: Not on file    Inability: Not on file  . Transportation needs    Medical: Not on file    Non-medical: Not on file  Tobacco Use  . Smoking status: Current Every Day Smoker    Packs/day: 0.50    Types: Cigarettes  . Smokeless tobacco: Never Used  Substance and Sexual Activity  . Alcohol use: Yes    Alcohol/week: 5.0 standard drinks    Types: 5 Shots of liquor per week  . Drug use: Yes    Frequency: 3.0 times per week    Types: Marijuana  .  Sexual activity: Yes    Birth control/protection: Condom  Lifestyle  . Physical activity    Days per week: Not on file    Minutes per session: Not on file  . Stress: Not on file  Relationships  . Social Musician on phone: Not on file    Gets together: Not on file    Attends religious service: Not on file    Active member of club or organization: Not on file    Attends meetings of clubs or organizations: Not on file    Relationship status: Not on file  . Intimate partner violence    Fear of current or ex partner: Not on file    Emotionally abused: Not on file    Physically abused: Not on file    Forced sexual activity: Not on file  Other Topics Concern  . Not on file  Social History Narrative  . Not  on file    History reviewed. No pertinent family history.   Review of Systems  Constitutional: Negative.  Negative for chills and fever.  HENT: Negative.  Negative for congestion and sore throat.   Eyes: Negative.   Respiratory: Negative.  Negative for cough and shortness of breath.   Cardiovascular: Negative.  Negative for chest pain and palpitations.  Gastrointestinal: Positive for diarrhea.  Genitourinary: Negative.  Negative for dysuria and hematuria.  Musculoskeletal: Positive for back pain.  Skin: Negative.  Negative for rash.  Neurological: Negative.  Negative for dizziness and headaches.  All other systems reviewed and are negative.   Vitals:   06/09/19 0823  BP: 121/86  Pulse: 63  Resp: 16  Temp: 98.2 F (36.8 C)  SpO2: 97%    Physical Exam Vitals signs reviewed.  Constitutional:      Appearance: Normal appearance.  HENT:     Head: Normocephalic.     Mouth/Throat:     Mouth: Mucous membranes are moist.     Pharynx: Oropharynx is clear.  Eyes:     Extraocular Movements: Extraocular movements intact.     Conjunctiva/sclera: Conjunctivae normal.     Pupils: Pupils are equal, round, and reactive to light.  Neck:     Musculoskeletal: Normal range of motion and neck supple.  Cardiovascular:     Rate and Rhythm: Normal rate and regular rhythm.     Pulses: Normal pulses.     Heart sounds: Normal heart sounds.  Pulmonary:     Effort: Pulmonary effort is normal.     Breath sounds: Normal breath sounds.  Abdominal:     General: Bowel sounds are normal. There is no distension.     Palpations: Abdomen is soft. There is no mass.     Tenderness: There is no abdominal tenderness. There is no guarding.  Musculoskeletal: Normal range of motion.  Skin:    General: Skin is warm and dry.     Capillary Refill: Capillary refill takes less than 2 seconds.  Neurological:     General: No focal deficit present.     Mental Status: He is alert and oriented to person, place,  and time.  Psychiatric:        Mood and Affect: Mood normal.        Behavior: Behavior normal.    Results for orders placed or performed in visit on 06/09/19 (from the past 24 hour(s))  POCT urinalysis dipstick     Status: Normal   Collection Time: 06/09/19  8:48 AM  Result Value Ref Range  Color, UA yellow yellow   Clarity, UA clear clear   Glucose, UA negative negative mg/dL   Bilirubin, UA negative negative   Ketones, POC UA negative negative mg/dL   Spec Grav, UA [id number] - 1.025   Blood, UA negative negative   pH, UA 6.0 5.0 - 8.0   Protein Ur, POC negative negative mg/dL   Urobilinogen, UA 0.2 0.2 or 1.0 E.U./dL   Nitrite, UA Negative Negative   Leukocytes, UA Negative Negative      ASSESSMENT & PLAN: Vincent Rivera was seen today for establish care and back pain.  Diagnoses and all orders for this visit:  Back pain, unspecified back location, unspecified back pain laterality, unspecified chronicity -     POCT urinalysis dipstick  Abdominal pain, unspecified abdominal location -     POCT urinalysis dipstick -     CBC with Differential -     Comprehensive metabolic panel -     Lipid panel  Encounter to establish care  Concern about STD in male without diagnosis -     STD Panel  Exposure to chlamydia -     GC/Chlamydia probe amp ([place])not at [facility] -     azithromycin (ZITHROMAX) 250 MG tablet; Take 4 tablets (1,000 mg total) by mouth once for 1 dose. Sig as indicated  Diarrhea, unspecified type -     CBC with Differential -     Comprehensive metabolic panel  Family history of diabetes mellitus -     Hemoglobin A1c    Patient Instructions       If you have lab work done today you will be contacted with your lab results within the next 2 weeks.  If you have not heard from us then please contact us. The fastest way to get your results is to register for My Chart.   IF you received an x-ray today, you will receive an invoice from [facility].  Please contact [facility] at [phone] with questions or concerns regarding your invoice.   IF you received labwork today, you will receive an invoice from [facility]. Please contact [facility] at [phone] with questions or concerns regarding your invoice.   Our billing staff will not be able to assist you with questions regarding bills from these companies.  You will be contacted with the lab results as soon as they are available. The fastest way to get your results is to activate your My Chart account. Instructions are located on the last page of this paperwork. If you have not heard from us regarding the results in 2 weeks, please contact this office.     Health Maintenance, Male Adopting a healthy lifestyle and getting preventive care are important in promoting health and wellness. Ask your health care provider about:  The right schedule for you to have regular tests and exams.  Things you can do on your own to prevent diseases and keep yourself healthy. What should I know about diet, weight, and exercise? Eat a healthy diet   Eat a diet that includes plenty of vegetables, fruits, low-fat dairy products, and lean protein.  Do not eat a lot of foods that are high in solid fats, added sugars, or sodium. Maintain a healthy weight Body mass index (BMI) is a measurement that can be used to identify possible weight problems. It estimates body fat based on height and weight. Your health care provider can help determine your BMI and help you achieve or maintain a healthy weight. Get regular exercise  Get regular exercise. This is one of the most important things you can do for your health. Most adults should:  Exercise for at least 150 minutes each week. The exercise should increase your heart rate and make you sweat (moderate-intensity exercise).  Do strengthening exercises at least twice a week. This is in addition to the moderate-intensity exercise.  Spend less time  sitting. Even light physical activity can be beneficial. Watch cholesterol and blood lipids Have your blood tested for lipids and cholesterol at 33 years of age, then have this test every 5 years. You may need to have your cholesterol levels checked more often if:  Your lipid or cholesterol levels are high.  You are older than 33 years of age.  You are at high risk for heart disease. What should I know about cancer screening? Many types of cancers can be detected early and may often be prevented. Depending on your health history and family history, you may need to have cancer screening at various ages. This may include screening for:  Colorectal cancer.  Prostate cancer.  Skin cancer.  Lung cancer. What should I know about heart disease, diabetes, and high blood pressure? Blood pressure and heart disease  High blood pressure causes heart disease and increases the risk of stroke. This is more likely to develop in people who have high blood pressure readings, are of African descent, or are overweight.  Talk with your health care provider about your target blood pressure readings.  Have your blood pressure checked: ? Every 3-5 years if you are 79-40 years of age. ? Every year if you are 67 years old or older.  If you are between the ages of 65 and 64 and are a current or former smoker, ask your health care provider if you should have a one-time screening for abdominal aortic aneurysm (AAA). Diabetes Have regular diabetes screenings. This checks your fasting blood sugar level. Have the screening done:  Once every three years after age 48 if you are at a normal weight and have a low risk for diabetes.  More often and at a younger age if you are overweight or have a high risk for diabetes. What should I know about preventing infection? Hepatitis B If you have a higher risk for hepatitis B, you should be screened for this virus. Talk with your health care provider to find out if you  are at risk for hepatitis B infection. Hepatitis C Blood testing is recommended for:  Everyone born from [birth year] through 1965.  Anyone with known risk factors for hepatitis C. Sexually transmitted infections (STIs)  You should be screened each year for STIs, including gonorrhea and chlamydia, if: ? You are sexually active and are younger than 33 years of age. ? You are older than 33 years of age and your health care provider tells you that you are at risk for this type of infection. ? Your sexual activity has changed since you were last screened, and you are at increased risk for chlamydia or gonorrhea. Ask your health care provider if you are at risk.  Ask your health care provider about whether you are at high risk for HIV. Your health care provider may recommend a prescription medicine to help prevent HIV infection. If you choose to take medicine to prevent HIV, you should first get tested for HIV. You should then be tested every 3 months for as long as you are taking the medicine. Follow these instructions at home: Lifestyle  Do not  use any products that contain nicotine or tobacco, such as cigarettes, e-cigarettes, and chewing tobacco. If you need help quitting, ask your health care provider.  Do not use street drugs.  Do not share needles.  Ask your health care provider for help if you need support or information about quitting drugs. Alcohol use  Do not drink alcohol if your health care provider tells you not to drink.  If you drink alcohol: ? Limit how much you have to 0-2 drinks a day. ? Be aware of how much alcohol is in your drink. In the [place] one drink equals one 12 oz bottle of beer (355 mL), one 5 oz glass of wine (148 mL), or one 1 oz glass of hard liquor (44 mL). General instructions  Schedule regular health, dental, and eye exams.  Stay current with your vaccines.  Tell your health care provider if: ? You often feel depressed. ? You have ever been abused or do  not feel safe at home. Summary  Adopting a healthy lifestyle and getting preventive care are important in promoting health and wellness.  Follow your health care provider's instructions about healthy diet, exercising, and getting tested or screened for diseases.  Follow your health care provider's instructions on monitoring your cholesterol and blood pressure. This information is not intended to replace advice given to you by your health care provider. Make sure you discuss any questions you have with your health care provider. Document Released: 12/20/2007 Document Revised: 06/16/2018 Document Reviewed: 06/16/2018 Elsevier Patient Education  2020 [employer]      Agustina Caroli, MD [facility]

## 2019-06-10 LAB — GC/CHLAMYDIA PROBE AMP (~~LOC~~) NOT AT ARMC
Chlamydia: NEGATIVE
Comment: NEGATIVE
Comment: NORMAL
Neisseria Gonorrhea: NEGATIVE

## 2019-06-11 LAB — CBC WITH DIFFERENTIAL/PLATELET
Basophils Absolute: 0.1 10*3/uL (ref 0.0–0.2)
Basos: 1 %
EOS (ABSOLUTE): 0.1 10*3/uL (ref 0.0–0.4)
Eos: 1 %
Hematocrit: 47.4 % (ref 37.5–51.0)
Hemoglobin: 15.9 g/dL (ref 13.0–17.7)
Immature Grans (Abs): 0 10*3/uL (ref 0.0–0.1)
Immature Granulocytes: 0 %
Lymphocytes Absolute: 1.5 10*3/uL (ref 0.7–3.1)
Lymphs: 21 %
MCH: 30.1 pg (ref 26.6–33.0)
MCHC: 33.5 g/dL (ref 31.5–35.7)
MCV: 90 fL (ref 79–97)
Monocytes Absolute: 0.7 10*3/uL (ref 0.1–0.9)
Monocytes: 10 %
Neutrophils Absolute: 4.9 10*3/uL (ref 1.4–7.0)
Neutrophils: 67 %
Platelets: 329 10*3/uL (ref 150–450)
RBC: 5.28 x10E6/uL (ref 4.14–5.80)
RDW: 12.3 % (ref 11.6–15.4)
WBC: 7.3 10*3/uL (ref 3.4–10.8)

## 2019-06-11 LAB — COMPREHENSIVE METABOLIC PANEL
ALT: 29 IU/L (ref 0–44)
AST: 28 IU/L (ref 0–40)
Albumin/Globulin Ratio: 1.4 (ref 1.2–2.2)
Albumin: 4.2 g/dL (ref 4.0–5.0)
Alkaline Phosphatase: 68 IU/L (ref 39–117)
BUN/Creatinine Ratio: 8 — ABNORMAL LOW (ref 9–20)
BUN: 8 mg/dL (ref 6–20)
Bilirubin Total: 0.3 mg/dL (ref 0.0–1.2)
CO2: 19 mmol/L — ABNORMAL LOW (ref 20–29)
Calcium: 9.4 mg/dL (ref 8.7–10.2)
Chloride: 106 mmol/L (ref 96–106)
Creatinine, Ser: 0.95 mg/dL (ref 0.76–1.27)
GFR calc Af Amer: 122 mL/min/{1.73_m2} (ref >59)
GFR calc non Af Amer: 105 mL/min/{1.73_m2} (ref >59)
Globulin, Total: 3 g/dL (ref 1.5–4.5)
Glucose: 89 mg/dL (ref 65–99)
Potassium: 4.6 mmol/L (ref 3.5–5.2)
Sodium: 141 mmol/L (ref 134–144)
Total Protein: 7.2 g/dL (ref 6.0–8.5)

## 2019-06-11 LAB — RPR+HSVIGM+HBSAG+HSV2(IGG)+...
HIV Screen 4th Generation wRfx: NONREACTIVE
HSV 2 IgG, Type Spec: <0.91 index (ref 0.00–0.90)
HSVI/II Comb IgM: <0.91 Ratio (ref 0.00–0.90)
Hepatitis B Surface Ag: NEGATIVE
RPR Ser Ql: NONREACTIVE

## 2019-06-11 LAB — LIPID PANEL
Chol/HDL Ratio: 3.1 ratio (ref 0.0–5.0)
Cholesterol, Total: 208 mg/dL — ABNORMAL HIGH (ref 100–199)
HDL: 67 mg/dL (ref >39)
LDL Chol Calc (NIH): 129 mg/dL — ABNORMAL HIGH (ref 0–99)
Triglycerides: 68 mg/dL (ref 0–149)
VLDL Cholesterol Cal: 12 mg/dL (ref 5–40)

## 2019-06-11 LAB — HEMOGLOBIN A1C
Est. average glucose Bld gHb Est-mCnc: 105 mg/dL
Hgb A1c MFr Bld: 5.3 % (ref 4.8–5.6)

## 2019-06-17 ENCOUNTER — Ambulatory Visit: Payer: Self-pay | Admitting: Internal Medicine

## 2019-08-17 ENCOUNTER — Ambulatory Visit: Payer: BLUE CROSS/BLUE SHIELD | Admitting: Emergency Medicine

## 2019-12-08 ENCOUNTER — Ambulatory Visit: Payer: BLUE CROSS/BLUE SHIELD | Admitting: Emergency Medicine

## 2019-12-09 ENCOUNTER — Encounter: Payer: Self-pay | Admitting: Emergency Medicine

## 2019-12-19 ENCOUNTER — Telehealth: Payer: BLUE CROSS/BLUE SHIELD

## 2022-08-01 DIAGNOSIS — H5789 Other specified disorders of eye and adnexa: Secondary | ICD-10-CM | POA: Diagnosis not present

## 2022-08-01 DIAGNOSIS — H5712 Ocular pain, left eye: Secondary | ICD-10-CM | POA: Diagnosis not present

## 2022-08-01 DIAGNOSIS — R0981 Nasal congestion: Secondary | ICD-10-CM | POA: Diagnosis not present

## 2022-08-01 DIAGNOSIS — H1089 Other conjunctivitis: Secondary | ICD-10-CM | POA: Diagnosis not present
